# Patient Record
Sex: Male | Born: 1980 | Race: Black or African American | Hispanic: No | Marital: Single | Smoking: Current some day smoker
Health system: Southern US, Community
[De-identification: ages and names within clinical notes are randomized; demographics above are authoritative.]

## PROBLEM LIST (undated history)

## (undated) ENCOUNTER — Ambulatory Visit: Payer: Self-pay

---
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
## 1998-01-06 ENCOUNTER — Emergency Department (HOSPITAL_COMMUNITY): Admission: EM | Admit: 1998-01-06 | Discharge: 1998-01-06 | Payer: Self-pay | Admitting: Emergency Medicine

## 1998-01-11 ENCOUNTER — Encounter: Admission: RE | Admit: 1998-01-11 | Discharge: 1998-01-11 | Payer: Self-pay | Admitting: Internal Medicine

## 1998-01-11 ENCOUNTER — Ambulatory Visit (HOSPITAL_COMMUNITY): Admission: RE | Admit: 1998-01-11 | Discharge: 1998-01-11 | Payer: Self-pay | Admitting: Internal Medicine

## 1998-01-11 ENCOUNTER — Ambulatory Visit (HOSPITAL_COMMUNITY): Admission: RE | Admit: 1998-01-11 | Discharge: 1998-01-11 | Payer: Self-pay

## 1998-06-21 ENCOUNTER — Encounter: Admission: RE | Admit: 1998-06-21 | Discharge: 1998-06-21 | Payer: Self-pay | Admitting: Internal Medicine

## 2003-02-25 ENCOUNTER — Emergency Department (HOSPITAL_COMMUNITY): Admission: AD | Admit: 2003-02-25 | Discharge: 2003-02-25 | Payer: Self-pay | Admitting: Family Medicine

## 2003-11-17 ENCOUNTER — Emergency Department (HOSPITAL_COMMUNITY): Admission: EM | Admit: 2003-11-17 | Discharge: 2003-11-17 | Payer: Self-pay | Admitting: Emergency Medicine

## 2005-11-04 ENCOUNTER — Emergency Department (HOSPITAL_COMMUNITY): Admission: EM | Admit: 2005-11-04 | Discharge: 2005-11-04 | Payer: Self-pay | Admitting: Emergency Medicine

## 2008-01-12 ENCOUNTER — Emergency Department (HOSPITAL_COMMUNITY): Admission: EM | Admit: 2008-01-12 | Discharge: 2008-01-12 | Payer: Self-pay | Admitting: Emergency Medicine

## 2008-12-12 ENCOUNTER — Emergency Department (HOSPITAL_COMMUNITY): Admission: EM | Admit: 2008-12-12 | Discharge: 2008-12-12 | Payer: Self-pay | Admitting: Emergency Medicine

## 2010-06-23 LAB — POCT I-STAT, CHEM 8
BUN: 7 mg/dL (ref 6–23)
Calcium, Ion: 1.16 mmol/L (ref 1.12–1.32)
HCT: 49 % (ref 39.0–52.0)
Sodium: 140 mEq/L (ref 135–145)
TCO2: 27 mmol/L (ref 0–100)

## 2010-06-23 LAB — D-DIMER, QUANTITATIVE: D-Dimer, Quant: 0.25 ug/mL-FEU (ref 0.00–0.48)

## 2011-08-09 IMAGING — CR DG CHEST 2V
2 series · 2 of 2 positions shown · non-contrast
Comparison: None available.

CLINICAL DATA: 28-year-old male with cough and cold symptoms.
Smoker.

CHEST - 2 VIEW

[w chest pa *]
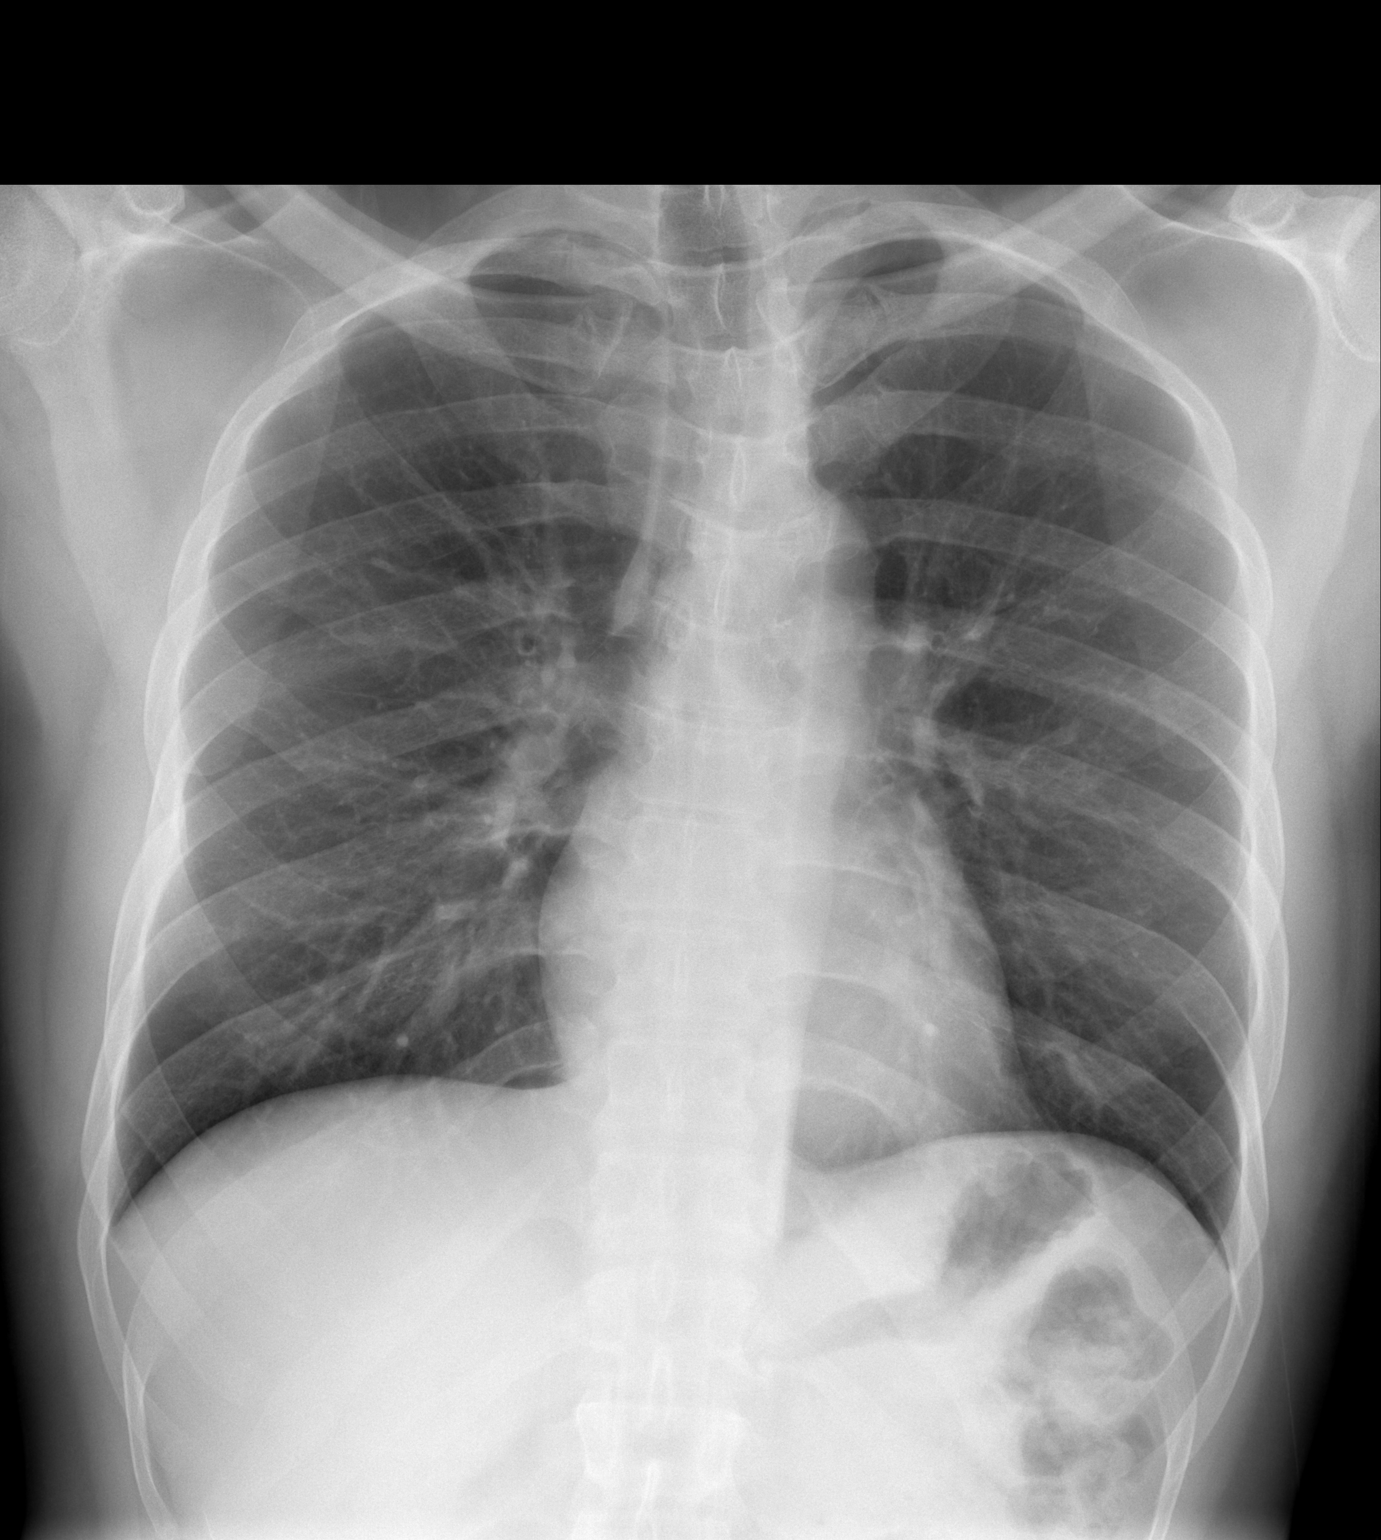

[w chest lat *]
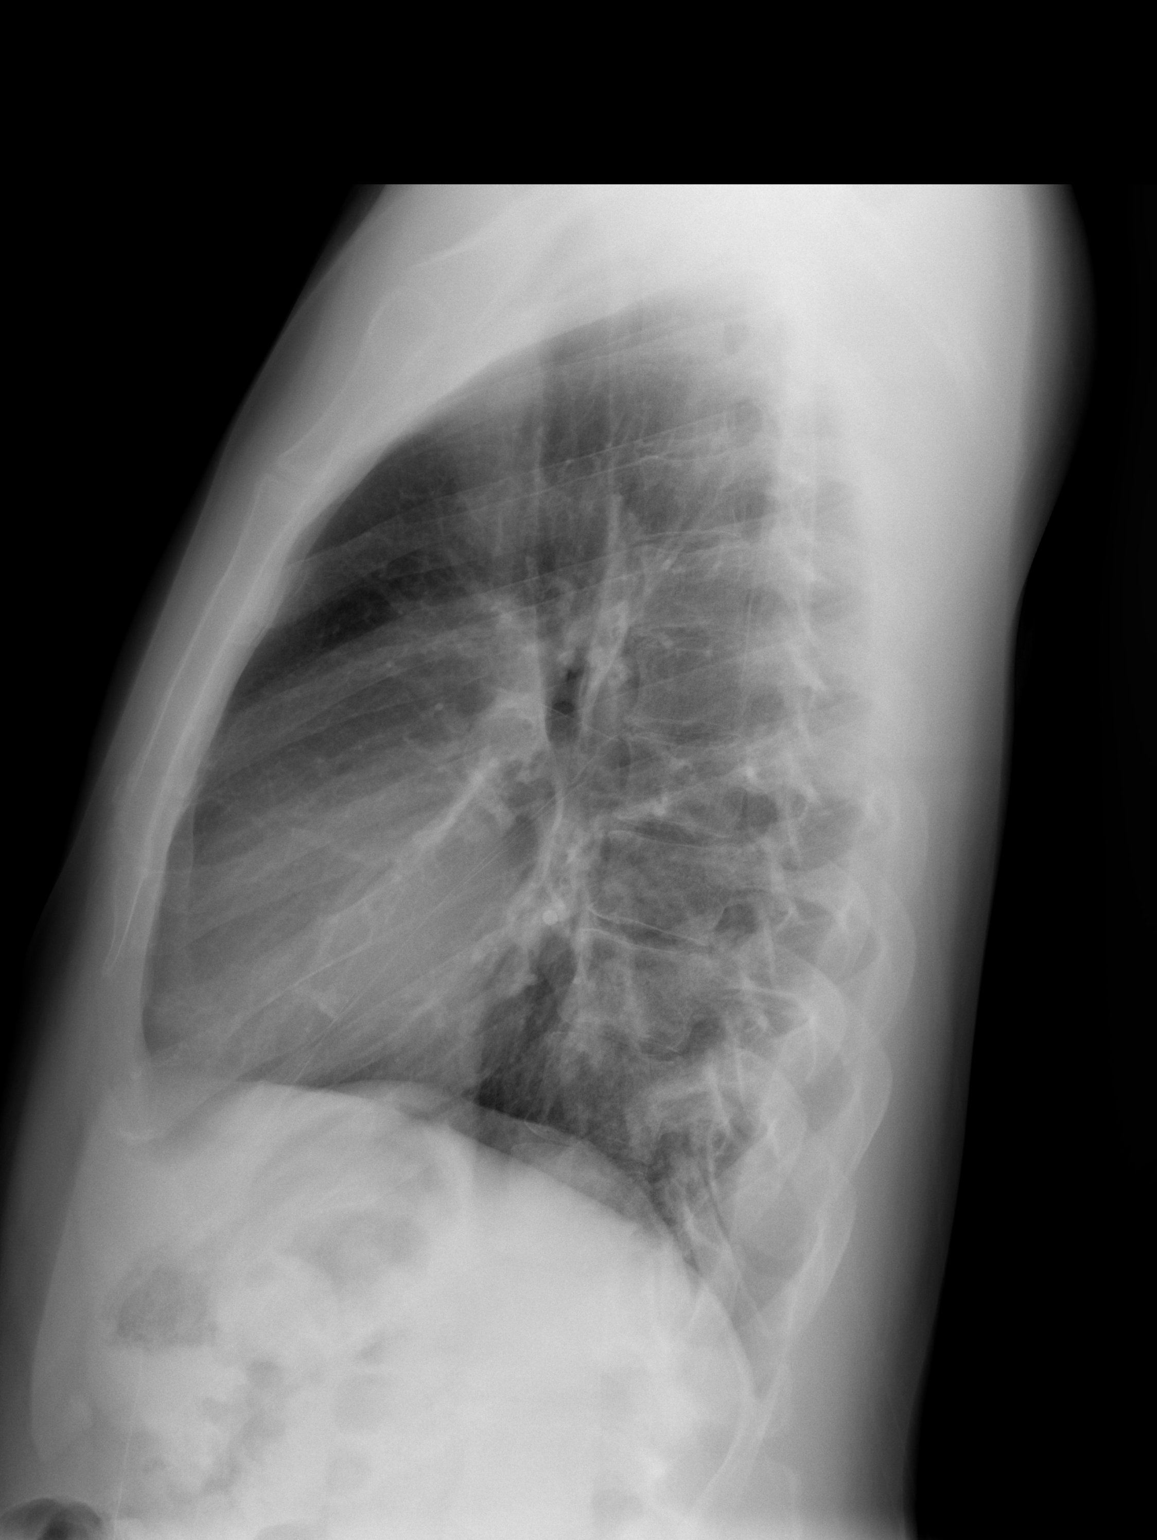

[2 of 2 positions shown; findings below may reference images not displayed]

FINDINGS: Cardiac size and mediastinal contours are within normal
limits.  Visualized tracheal air column is within normal limits.
No pneumothorax, pulmonary edema, pleural effusion, or
consolidation.  Mild volume loss at the left base, but no confluent
airspace opacity.  Mild scoliosis. No acute osseous abnormality
identified.
IMPRESSION: No acute cardiopulmonary abnormality.

## 2013-11-17 ENCOUNTER — Telehealth: Payer: Self-pay | Admitting: *Deleted

## 2013-11-17 NOTE — Telephone Encounter (Signed)
error 

## 2020-04-18 ENCOUNTER — Emergency Department (HOSPITAL_COMMUNITY): Payer: 59

## 2020-04-18 ENCOUNTER — Emergency Department (HOSPITAL_COMMUNITY)
Admission: EM | Admit: 2020-04-18 | Discharge: 2020-04-18 | Disposition: A | Discharge status: Home / Self Care | Payer: 59 | Attending: Emergency Medicine | Admitting: Emergency Medicine

## 2020-04-18 ENCOUNTER — Other Ambulatory Visit: Payer: Self-pay

## 2020-04-18 DIAGNOSIS — Z20822 Contact with and (suspected) exposure to covid-19: Secondary | ICD-10-CM | POA: Diagnosis not present

## 2020-04-18 DIAGNOSIS — R0602 Shortness of breath: Secondary | ICD-10-CM | POA: Diagnosis not present

## 2020-04-18 DIAGNOSIS — R079 Chest pain, unspecified: Secondary | ICD-10-CM | POA: Diagnosis present

## 2020-04-18 DIAGNOSIS — R0789 Other chest pain: Secondary | ICD-10-CM | POA: Diagnosis not present

## 2020-04-18 LAB — TROPONIN I (HIGH SENSITIVITY)
Troponin I (High Sensitivity): <2 ng/L (ref <18)
Troponin I (High Sensitivity): <2 ng/L (ref <18)

## 2020-04-18 LAB — CBC
HCT: 44.5 % (ref 39.0–52.0)
Hemoglobin: 15.5 g/dL (ref 13.0–17.0)
MCH: 31.8 pg (ref 26.0–34.0)
MCHC: 34.8 g/dL (ref 30.0–36.0)
MCV: 91.2 fL (ref 80.0–100.0)
Platelets: 177 10*3/uL (ref 150–400)
RBC: 4.88 MIL/uL (ref 4.22–5.81)
RDW: 13.2 % (ref 11.5–15.5)
WBC: 3.8 10*3/uL — ABNORMAL LOW (ref 4.0–10.5)
nRBC: 0 % (ref 0.0–0.2)

## 2020-04-18 LAB — SARS CORONAVIRUS 2 (TAT 6-24 HRS): SARS Coronavirus 2: NEGATIVE

## 2020-04-18 LAB — BASIC METABOLIC PANEL
Anion gap: 8 (ref 5–15)
BUN: 12 mg/dL (ref 6–20)
CO2: 23 mmol/L (ref 22–32)
Calcium: 9.6 mg/dL (ref 8.9–10.3)
Chloride: 108 mmol/L (ref 98–111)
Creatinine, Ser: 0.95 mg/dL (ref 0.61–1.24)
GFR, Estimated: >60 mL/min (ref >60)
Glucose, Bld: 104 mg/dL — ABNORMAL HIGH (ref 70–99)
Potassium: 3.9 mmol/L (ref 3.5–5.1)
Sodium: 139 mmol/L (ref 135–145)

## 2020-04-18 MED ORDER — ALBUTEROL SULFATE HFA 108 (90 BASE) MCG/ACT IN AERS
2.0000 | INHALATION_SPRAY | Freq: Once | RESPIRATORY_TRACT | Status: DC
  Filled 2020-04-18: qty 6.7

## 2020-04-18 MED ORDER — IBUPROFEN 400 MG PO TABS
600.0000 mg | ORAL_TABLET | Freq: Once | ORAL | Status: DC
  Filled 2020-04-18: qty 1

## 2020-04-18 NOTE — ED Provider Notes (Signed)
[facility] EMERGENCY DEPARTMENT Provider Note   CSN: [id number] Arrival date & time: 04/18/20  7169     History Chief Complaint  Patient presents with  . Chest Pain    Ryan Bailey is a 40 y.o. male who presents to the ED today with complaint of gradual onset, intermittent, achy, right sided chest pain that began last week. Pt reports that the pain seems to be present at work when he has to wear his mask - he states that since the new mask mandate has been in place in [facility] his work has made him wear a mask. Pt states he has a history of asthma and feels like he can't breathe when he wears a mask which then makes him have chest pain. Pt states when he takes the mask off and uses his inhaler the pain and shortness of breath will go away. He also mentions that he has been working about 72 hours this past week lifting approximately 50 pounds of material at work intermittently and is unsure if this could be causing his pain. He has not taken anything including Ibuprofen or Tylenol for the pain. Pt is a current everyday smoker - smokes 1 pack every 2-3 weeks. He also endorses Fhx of CAD. No hx DVT/PE. No recent prolonged travel or immobilization. No hemoptysis. No active malignancy. No exogenous hormone use. Denies fevers, chills, diaphoresis, nausea, vomiting, leg swelling, cough, or any other associated symptoms.   The history is provided by the patient and medical records.       No past medical history on file.  There are no problems to display for this patient.   No family history on file.     Home Medications Prior to Admission medications   Not on File    Allergies    Patient has no known allergies.  Review of Systems   Review of Systems  Constitutional: Negative for chills, diaphoresis and fever.  Respiratory: Positive for shortness of breath. Negative for cough.   Cardiovascular: Positive for chest pain.  Gastrointestinal: Negative for  nausea and vomiting.  All other systems reviewed and are negative.   Physical Exam Updated Vital Signs BP 109/70   Pulse (!) 58   Temp 98.8 F (37.1 C) (Oral)   Resp 15   Ht 5\' 8"  (1.727 m)   Wt 99.8 kg   SpO2 100%   BMI 33.45 kg/m   Physical Exam Vitals and nursing note reviewed.  Constitutional:      Appearance: He is not ill-appearing or diaphoretic.  HENT:     Head: Normocephalic and atraumatic.  Eyes:     Conjunctiva/sclera: Conjunctivae normal.  Cardiovascular:     Rate and Rhythm: Normal rate and regular rhythm.     Pulses:          Radial pulses are 2+ on the right side and 2+ on the left side.       Dorsalis pedis pulses are 2+ on the right side and 2+ on the left side.     Heart sounds: Normal heart sounds.  Pulmonary:     Effort: Pulmonary effort is normal.     Breath sounds: Normal breath sounds. No decreased breath sounds, wheezing, rhonchi or rales.  Chest:     Chest wall: Tenderness present.     Comments: + right sided chest wall TTP Abdominal:     Palpations: Abdomen is soft.     Tenderness: There is no abdominal tenderness. There is  no guarding or rebound.  Musculoskeletal:     Cervical back: Neck supple.     Right lower leg: No tenderness. No edema.     Left lower leg: No tenderness. No edema.  Skin:    General: Skin is warm and dry.  Neurological:     Mental Status: He is alert.     ED Results / Procedures / Treatments   Labs (all labs ordered are listed, but only abnormal results are displayed) Labs Reviewed  BASIC METABOLIC PANEL - Abnormal; Notable for the following components:      Result Value   Glucose, Bld 104 (*)    All other components within normal limits  CBC - Abnormal; Notable for the following components:   WBC 3.8 (*)    All other components within normal limits  SARS CORONAVIRUS 2 (TAT 6-24 HRS)  TROPONIN I (HIGH SENSITIVITY)  TROPONIN I (HIGH SENSITIVITY)    EKG None  Radiology DG Chest 2 View  Result Date:  04/18/2020 CLINICAL DATA:  Chest pain, asthma EXAM: CHEST - 2 VIEW COMPARISON:  12/12/2008 FINDINGS: Lungs are well expanded, symmetric, and clear. No pneumothorax or pleural effusion. Cardiac size within normal limits. Pulmonary vascularity is normal. Osseous structures are age-appropriate. No acute bone abnormality. IMPRESSION: No active cardiopulmonary disease. Electronically Signed   By: Helyn Numbers MD   On: 04/18/2020 10:18    Procedures Procedures   Medications Ordered in ED Medications  ibuprofen (ADVIL) tablet 600 mg (600 mg Oral Not Given 04/18/20 1328)  albuterol (VENTOLIN HFA) 108 (90 Base) MCG/ACT inhaler 2 puff (has no administration in time range)    ED Course  I have reviewed the triage vital signs and the nursing notes.  Pertinent labs & imaging results that were available during my care of the patient were reviewed by me and considered in my medical decision making (see chart for details).    MDM Rules/Calculators/A&P                          40 year old male who presents to the ED today complaining of intermittent right-sided chest pain that he attributes to having to wear a mask at work.  Reports history of asthma and states that he cannot wear a mask for a prolonged period of time.  Patient also endorses he has been working more often than normal where he is required to lift 50 pounds at a time.  He has not been taking anything for his pain.  On arrival to the ED vitals are stable.  Patient is afebrile, nontachycardic and nontachypneic.  EKG done while in the waiting room does show nonspecific T wave abnormality, no previous to compare to.  X-ray without signs of infection.  CBC with a leukopenia of 3.8.  BMP without electrolyte abnormalities.  Hemoglobin stable at 15.5.  Initial troponin less than 2.  Appears that a repeat troponin has already been collected and is in process.  On my exam patient appears to be in no acute distress.  He does have some chest wall tenderness  palpation which I suspect is musculoskeletal in nature given recent increase in working and heavy lifting.  We will plan to await on this repeat Trope however I have very low suspicion for ACS at this time.  He has no risk factors for PE and I am able to [place] him out.  He does report he is unsure if he has been around anyone who has been  sick as there has been many people out at work.  We will plan to swab for Covid as well.  Repeat troponin less than 2.  Will discharge home at this time.  Nursing staff informing that patient is requesting an albuterol inhaler at this time as he reports a history of asthma.  He has no wheezing on exam.  I am not able to see history of asthma at this time and patient did tell me that he was using an albuterol inhaler between wearing a mask at work.  Will provide at this time.  He is apparently also requesting a work note to state that he can have breaks from his mask.  I do not feel this is appropriate today.  We will have him discuss this with his PCP.  Patient to be discharged home at this time.  This note was prepared using Dragon voice recognition software and may include unintentional dictation errors due to the inherent limitations of voice recognition software.  Final Clinical Impression(s) / ED Diagnoses Final diagnoses:  Chest wall pain    Rx / DC Orders ED Discharge Orders    None       Discharge Instructions     Your workup was reassuring today. We have swabbed you for COVID - your results should return within the next day. Please await your results.   I would recommend Ibuprofen and Tylenol as needed for pain.   Follow up with your PCP regarding your ED visit today  Return to the ED for any worsening symptoms       Tanda Rockers, PA-C 04/18/20 1410    Virgina Norfolk, DO 04/18/20 1522

## 2020-04-18 NOTE — ED Triage Notes (Signed)
Pt reports R sided chest pain since Wednesday. Pain lessens with movement/activity. Does report working >60 hours last week and first noticed pain while at work.

## 2020-04-18 NOTE — ED Notes (Signed)
Reviewed discharge instructions with patient. Follow-up care and medications reviewed. Patient  verbalized understanding. Patient A&Ox4, VSS, and ambulatory with steady gait upon discharge.  °

## 2020-04-18 NOTE — Discharge Instructions (Addendum)
Your workup was reassuring today. We have swabbed you for COVID - your results should return within the next day. Please await your results.   I would recommend Ibuprofen and Tylenol as needed for pain.   Follow up with your PCP regarding your ED visit today  Return to the ED for any worsening symptoms

## 2020-06-06 ENCOUNTER — Other Ambulatory Visit: Payer: Self-pay

## 2020-06-06 ENCOUNTER — Ambulatory Visit
Admission: EM | Admit: 2020-06-06 | Discharge: 2020-06-06 | Disposition: A | Discharge status: Home / Self Care | Payer: 59

## 2020-06-06 DIAGNOSIS — R040 Epistaxis: Secondary | ICD-10-CM

## 2020-06-06 DIAGNOSIS — G4440 Drug-induced headache, not elsewhere classified, not intractable: Secondary | ICD-10-CM | POA: Diagnosis not present

## 2020-06-06 DIAGNOSIS — T3995XA Adverse effect of unspecified nonopioid analgesic, antipyretic and antirheumatic, initial encounter: Secondary | ICD-10-CM

## 2020-06-06 MED ORDER — TIZANIDINE HCL 2 MG PO TABS: 4.0000 mg | ORAL_TABLET | Freq: Four times a day (QID) | ORAL | 0 refills | Status: DC | PRN

## 2020-06-06 MED ORDER — TIZANIDINE HCL 4 MG PO TABS: 4.0000 mg | ORAL_TABLET | Freq: Every day | ORAL | 0 refills | Status: AC

## 2020-06-06 NOTE — Discharge Instructions (Addendum)
Discontinue taking aspirin.  For acute pain naproxen 500 mg twice daily as needed (brand name Aleve) for arthritis pain.  I sent over a muscle relaxer for you to take at bedtime only for muscular pain or  headache.

## 2020-06-06 NOTE — ED Triage Notes (Signed)
Pt c/o migraine headache since Thursday. States on Friday when he had his headache his nose started bleeding heavy BRB. States had 2 nose bleeds today. States his headaches goes away after his nosebleeds.

## 2020-06-06 NOTE — ED Provider Notes (Signed)
[facility]    CSN: [id number] Arrival date & time: 06/06/20  1839      History   Chief Complaint Chief Complaint  Patient presents with   Migraine    HPI Ryan Bailey is a 40 y.o. male.   HPI  Patient here for several days of epistaxis and HA. Patient has been taking 1000 mg of ASA for a few weeks for foot and back pain. He is also taking Naproxen at the same time. No tarry stool. No hemoptysis. Endorses a dull HA that has persistent for several days. NO N&V or light sensitivity     History reviewed. No pertinent past medical history.  There are no problems to display for this patient.   History reviewed. No pertinent surgical history.     Home Medications    Prior to Admission medications   Not on File    Family History History reviewed. No pertinent family history.  Social History Social History   Tobacco Use   Smoking status: Current Every Day Smoker   Smokeless tobacco: Never Used  Substance Use Topics   Alcohol use: Yes    Comment: OCC   Drug use: Not Currently     Allergies   Penicillins   Review of Systems Review of Systems Pertinent negatives listed in HPI   Physical Exam Triage Vital Signs ED Triage Vitals  Enc Vitals Group     BP 06/06/20 1940 115/74     Pulse Rate 06/06/20 1940 66     Resp 06/06/20 1940 20     Temp 06/06/20 1940 98.3 F (36.8 C)     Temp Source 06/06/20 1940 Oral     SpO2 06/06/20 1940 96 %     Weight --      Height --      Head Circumference --      Peak Flow --      Pain Score 06/06/20 1953 8     Pain Loc --      Pain Edu? --      Excl. in GC? --    No data found.  Updated Vital Signs BP 115/74 (BP Location: Left Arm)    Pulse 66    Temp 98.3 F (36.8 C) (Oral)    Resp 20    SpO2 96%   Visual Acuity Right Eye Distance:   Left Eye Distance:   Bilateral Distance:    Right Eye Near:   Left Eye Near:    Bilateral Near:     Physical Exam  General Appearance:    Alert,  cooperative, no distress  HENT:   Normocephalic, ears normal, nares normal,  oropharynx cleat   Eyes:    PERRL, conjunctiva/corneas clear, EOM's intact       Lungs:     Clear to auscultation bilaterally, respirations unlabored  Heart:    Regular rate and rhythm  Neurologic:   Awake, alert, oriented x 3. No apparent focal neurological           defect.      UC Treatments / Results  Labs (all labs ordered are listed, but only abnormal results are displayed) Labs Reviewed - No data to display  EKG   Radiology No results found.  Procedures Procedures (including critical care time)  Medications Ordered in UC Medications - No data to display  Initial Impression / Assessment and Plan / UC Course  I have reviewed the triage vital signs and the nursing notes.  Pertinent labs &  imaging results that were available during my care of the patient were reviewed by me and considered in my medical decision making (see chart for details).     Discontinue ASA. Tizanidine PRN as prescribed for HA. For aches and pain, start Naproxen OTC    Final Clinical Impressions(s) / UC Diagnoses   Final diagnoses:  Other migraine without status migrainosus, not intractable  Analgesic rebound headache     Discharge Instructions     Discontinue taking aspirin.  For acute pain naproxen 500 mg twice daily as needed (brand name Aleve) for arthritis pain.  I sent over a muscle relaxer for you to take at bedtime only for muscular pain or  headache.    ED Prescriptions    Medication Sig Dispense Auth. Provider   tiZANidine (ZANAFLEX) 2 MG tablet  (Status: Discontinued) Take 2 tablets (4 mg total) by mouth every 6 (six) hours as needed for muscle spasms. 30 tablet Bing Neighbors, FNP   tiZANidine (ZANAFLEX) 4 MG tablet Take 1 tablet (4 mg total) by mouth at bedtime. 30 tablet Bing Neighbors, FNP     PDMP not reviewed this encounter.   Bing Neighbors, [place] 06/07/20 [phone]

## 2020-10-23 ENCOUNTER — Ambulatory Visit
Admission: EM | Admit: 2020-10-23 | Discharge: 2020-10-23 | Disposition: A | Discharge status: Home / Self Care | Payer: 59 | Attending: Urgent Care | Admitting: Urgent Care

## 2020-10-23 NOTE — ED Triage Notes (Signed)
Patient presents to Urgent Care with complaints of cough, dizziness, sweating, and headache x 2-3 days ago. Pt states he concerned may be related to high BP since he has a family hx of HTN. He also concerned it may be related to covid with several co workers testing positive for COVID.   Denies fever, SOB, or chest pain.

## 2020-10-23 NOTE — ED Notes (Signed)
Pt was brought back he declined to be seen by provider today. He states he prefers to wait to come back tomorrow.

## 2022-03-30 ENCOUNTER — Ambulatory Visit (INDEPENDENT_AMBULATORY_CARE_PROVIDER_SITE_OTHER): Payer: Self-pay | Admitting: Physician Assistant

## 2022-03-30 ENCOUNTER — Encounter (HOSPITAL_COMMUNITY): Payer: Self-pay | Admitting: Physician Assistant

## 2022-03-30 ENCOUNTER — Ambulatory Visit (INDEPENDENT_AMBULATORY_CARE_PROVIDER_SITE_OTHER): Payer: No Payment, Other | Admitting: Clinical

## 2022-03-30 DIAGNOSIS — F331 Major depressive disorder, recurrent, moderate: Secondary | ICD-10-CM

## 2022-03-30 DIAGNOSIS — F99 Mental disorder, not otherwise specified: Secondary | ICD-10-CM

## 2022-03-30 DIAGNOSIS — F3160 Bipolar disorder, current episode mixed, unspecified: Secondary | ICD-10-CM

## 2022-03-30 DIAGNOSIS — F5105 Insomnia due to other mental disorder: Secondary | ICD-10-CM

## 2022-03-30 MED ORDER — ARIPIPRAZOLE 5 MG PO TABS: 5.0000 mg | ORAL_TABLET | Freq: Every day | ORAL | 1 refills | Status: DC

## 2022-03-30 MED ORDER — TRAZODONE HCL 50 MG PO TABS: 50.0000 mg | ORAL_TABLET | Freq: Every day | ORAL | 1 refills | Status: AC
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
## 2022-04-20 ENCOUNTER — Ambulatory Visit
Admission: EM | Admit: 2022-04-20 | Discharge: 2022-04-20 | Disposition: A | Discharge status: Home / Self Care | Payer: Self-pay | Attending: Physician Assistant | Admitting: Physician Assistant

## 2022-04-20 ENCOUNTER — Ambulatory Visit: Payer: Self-pay

## 2022-04-20 DIAGNOSIS — H8110 Benign paroxysmal vertigo, unspecified ear: Secondary | ICD-10-CM | POA: Insufficient documentation

## 2022-04-20 DIAGNOSIS — Z1152 Encounter for screening for COVID-19: Secondary | ICD-10-CM | POA: Insufficient documentation

## 2022-04-20 DIAGNOSIS — J069 Acute upper respiratory infection, unspecified: Secondary | ICD-10-CM | POA: Insufficient documentation

## 2022-04-20 MED ORDER — MECLIZINE HCL 25 MG PO TABS: 25.0000 mg | ORAL_TABLET | Freq: Three times a day (TID) | ORAL | 0 refills | Status: AC | PRN

## 2022-04-20 NOTE — ED Triage Notes (Addendum)
Patient presents to [facility] [place] for dizziness since yesterday. Back pain and cough since today. Concerned with possible covid, exposed to sick co-worker. Pt also concerned with carbon monoxide exposure. States he was in a clients home that had a weird smell. Reports he has noticed when he changes position he gets dizzy this has been on-going for 2 weeks. Also reports that he quit drinking alcohol approx. 4 months ago. States he would have a pint a day. Concerned with possible wtihdrawal side effects.
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
## 2022-04-21 LAB — SARS CORONAVIRUS 2 (TAT 6-24 HRS): SARS Coronavirus 2: POSITIVE — AB

## 2022-04-24 ENCOUNTER — Ambulatory Visit (INDEPENDENT_AMBULATORY_CARE_PROVIDER_SITE_OTHER): Payer: No Payment, Other | Admitting: Clinical

## 2022-04-24 DIAGNOSIS — F331 Major depressive disorder, recurrent, moderate: Secondary | ICD-10-CM
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
## 2022-05-08 ENCOUNTER — Ambulatory Visit (INDEPENDENT_AMBULATORY_CARE_PROVIDER_SITE_OTHER): Payer: No Payment, Other | Admitting: Clinical

## 2022-05-08 ENCOUNTER — Ambulatory Visit (INDEPENDENT_AMBULATORY_CARE_PROVIDER_SITE_OTHER): Payer: No Payment, Other | Admitting: Physician Assistant

## 2022-05-08 ENCOUNTER — Encounter (HOSPITAL_COMMUNITY): Payer: Self-pay | Admitting: Physician Assistant

## 2022-05-08 VITALS — BP 128/78 | HR 67 | Temp 98.2°F | Ht 70.0 in | Wt 210.0 lb

## 2022-05-08 DIAGNOSIS — F99 Mental disorder, not otherwise specified: Secondary | ICD-10-CM

## 2022-05-08 DIAGNOSIS — F331 Major depressive disorder, recurrent, moderate: Secondary | ICD-10-CM | POA: Diagnosis not present

## 2022-05-08 DIAGNOSIS — F320 Major depressive disorder, single episode, mild: Secondary | ICD-10-CM | POA: Diagnosis not present

## 2022-05-08 DIAGNOSIS — F5105 Insomnia due to other mental disorder: Secondary | ICD-10-CM | POA: Diagnosis not present

## 2022-05-08 MED ORDER — ARIPIPRAZOLE 5 MG PO TABS: 5.0000 mg | ORAL_TABLET | Freq: Every day | ORAL | 1 refills | Status: AC

## 2022-05-08 NOTE — Progress Notes (Signed)
[place] MD/PA/NP OP Progress Note  05/08/2022 8:44 PM Ryan Bailey  MRN:  [id number]  Chief Complaint:  Chief Complaint  Patient presents with   Follow-up   Medication Refill   HPI:   Ryan Bailey is a 42 year old, African-American male with a past psychiatric history significant for possible bipolar disorder and insomnia who presents to [facility] for follow up and medication management.  Patient is currently being managed on the following psychiatric medications:  Abilify 5 mg daily Trazodone 50 mg at bedtime as needed  Patient reports that he was recently kicked out of the home by his partner.  In addition to being kicked out of the house, patient states that his ex-partner had out a 50-B on him.  Patient reports that he was kicked out of the house because his ex-partner stated that he was drinking alcohol while taking pills in the presence of suicidal ideations.  Patient admits to drinking alcohol prior to being kicked out of the house but denies taking pills at the same time.  Since being kicked out of the house, patient endorses elevated anxiety that he rates at 10 out of 10.  Patient notes that he wants to be back home with his partner and her kids.  He reports that being around the kids makes him want to be better.  Patient is currently living in a motel with his 20 year old child.  Patient denies suicidal ideations and states that he has been going to [place] as well as therapy.  Patient denies depressive episodes but states that it hurts that he is unable to see his ex partner's kids.  He reports that his 95 year old son wants to go back to the house but states that he his son does not want to leave him in the hotel..  Patient reports that he will keep his distance from his ex-partner stating that if the 50-B goes through, then he will lose his job.  Before concluding the encounter, patient acknowledged that whenever he was down and out, his ex-partner  was there for him.  He states that she has been with him through thick and thin and he is sorry that he disappointed her by drinking.  Patient states that he is sorry for not having enough sense and feels like a wounded dog in this world and he is just trying to survive.  A PHQ-9 screen was performed with the patient scoring a 9.  Although patient endorsed anxiety, he scored a 0 during his GAD-7 screen.  Patient is alert and oriented x 4, calm, cooperative, and engaged in conversation during the encounter.  Patient states that his mood is all right but reports that he is worried about his ex-partner and that he feels hopeless at this time.  Patient denies suicidal or homicidal ideations.  Patient further denies auditory or visual hallucinations and does not appear to be responding to internal/external stimuli.  Patient endorses fair sleep and receives on average 8 hours of sleep each night.  Patient endorses fair appetite and eats on average 2 meals per day.  Patient denies alcohol consumption at this time.  Patient endorses tobacco use stating that he last used 3 days ago.  Patient denies illicit drug use.  Visit Diagnosis:    ICD-10-CM   1. Insomnia due to other mental disorder  F51.05    F99     2. Current mild episode of major depressive disorder, unspecified whether recurrent (HCC)  F32.0 ARIPiprazole (ABILIFY) 5  MG tablet      Past Psychiatric History:  Major depressive disorder vs bipolar disorder Insomnia  Past Medical History: History reviewed. No pertinent past medical history. History reviewed. No pertinent surgical history.  Family Psychiatric History:  Patient is unsure of a family history of psychiatric illness   Family history of suicide: Patient states that his brother killed himself 2 years ago Family history of homicide: Patient reports that his cousin is in prison for allegedly killing someone Family history of substance abuse: Patient reports that both parents abuse drugs  and alcohol  Family History: History reviewed. No pertinent family history.  Social History:  Social History   Socioeconomic History   Marital status: Single    Spouse name: Not on file   Number of children: Not on file   Years of education: Not on file   Highest education level: Not on file  Occupational History   Not on file  Tobacco Use   Smoking status: Some Days    Types: Cigarettes   Smokeless tobacco: Never  Vaping Use   Vaping Use: Never used  Substance and Sexual Activity   Alcohol use: Yes   Drug use: Not Currently   Sexual activity: Not on file  Other Topics Concern   Not on file  Social History Narrative   Not on file   Social Determinants of Health   Financial Resource Strain: Not on file  Food Insecurity: Not on file  Transportation Needs: Not on file  Physical Activity: Not on file  Stress: Not on file  Social Connections: Not on file    Allergies:  Allergies  Allergen Reactions   Penicillins     Metabolic Disorder Labs: No results found for: "HGBA1C", "MPG" No results found for: "PROLACTIN" No results found for: "CHOL", "TRIG", "HDL", "CHOLHDL", "VLDL", "LDLCALC" No results found for: "TSH"  Therapeutic Level Labs: No results found for: "LITHIUM" No results found for: "VALPROATE" No results found for: "CBMZ"  Current Medications: Current Outpatient Medications  Medication Sig Dispense Refill   ARIPiprazole (ABILIFY) 5 MG tablet Take 1 tablet (5 mg total) by mouth daily. 30 tablet 1   meclizine (ANTIVERT) 25 MG tablet Take 1 tablet (25 mg total) by mouth 3 (three) times daily as needed for dizziness. 30 tablet 0   tiZANidine (ZANAFLEX) 4 MG tablet Take 1 tablet (4 mg total) by mouth at bedtime. 30 tablet 0   traZODone (DESYREL) 50 MG tablet Take 1 tablet (50 mg total) by mouth at bedtime. 30 tablet 1   No current facility-administered medications for this visit.     Musculoskeletal: Strength & Muscle Tone: within normal limits Gait  & Station: normal Patient leans: N/A  Psychiatric Specialty Exam: Review of Systems  Psychiatric/Behavioral:  Negative for decreased concentration, dysphoric mood, hallucinations, self-injury, sleep disturbance and suicidal ideas. The patient is nervous/anxious. The patient is not hyperactive.     There were no vitals taken for this visit.There is no height or weight on file to calculate BMI.  General Appearance: Casual  Eye Contact:  Good  Speech:  Clear and Coherent and Normal Rate  Volume:  Normal  Mood:  Anxious and Depressed  Affect:  Appropriate  Thought Process:  Coherent and Descriptions of Associations: Intact  Orientation:  Full (Time, Place, and Person)  Thought Content: WDL   Suicidal Thoughts:  No  Homicidal Thoughts:  No  Memory:  Immediate;   Good Recent;   Good Remote;   Fair  Judgement:  Fair  Insight:  Shallow  Psychomotor Activity:  Normal  Concentration:  Concentration: Good and Attention Span: Good  Recall:  Good  Fund of Knowledge: Good  Language: Good  Akathisia:  No  Handed:  Right  AIMS (if indicated): not done  Assets:  Communication Skills Desire for Improvement Housing Social Support Vocational/Educational  ADL's:  Intact  Cognition: WNL  Sleep:  Fair   Screenings: GAD-7    Flowsheet Row Clinical Support from 05/08/2022 in [facility] Office Visit from 03/30/2022 in [facility]  Total GAD-7 Score 0 21      PHQ2-9    Flowsheet Row Clinical Support from 05/08/2022 in [facility] Counselor from 03/30/2022 in [facility]  PHQ-2 Total Score 0 2  PHQ-9 Total Score 9 8      Flowsheet Row Clinical Support from 05/08/2022 in [facility] ED from 04/20/2022 in [facility] Office Visit from 03/30/2022 in [place]  No Risk No Risk No Risk        Assessment and Plan:   Ryan Bailey is a 42 year old, African-American male with a past psychiatric history significant for possible bipolar disorder and insomnia who presents to [facility] for follow up and medication management.  Patient appears to exhibit some depressive symptoms and anxiety attributed to recently being kicked out of his ex-partner's house due to alleged alcohol consumption with pills in the setting of suicidal ideations.  Patient states that his ex-partner also took out a 50-B on him and is fearful that the 50-B will cost him his job if it goes through.  Patient exhibited remorse during the assessment regarding all that he has put his ex-partner through.  During the assessment, patient asked if his ex-partner would care if he died.  Provider informed patient that making a threat on her life if not the way to his ex partner to care about him.  Patient denied wanting to harm himself or making threats on his life but expressed that he feels that his [place] does not care about him anymore.  Provider recommended patient focus on himself by getting himself better and attending AA.  Provider also recommended patient continuing to take his medications as prescribed.  Patient was agreeable to recommendation.  Upon assessment, patient symptoms do not appear to be strongly associated with bipolar disorder.  Patient's symptoms appear to reflect major depressive disorder attributed to experiencing many losses in his life as well as being away from his ex-partner at this time.  Provider to treat patient for his MDD with Abilify 5 mg daily.  Patient to continue taking trazodone 50 mg at bedtime for the management of his sleep.  Patient's medication to be e-prescribed to pharmacy of choice.  Collaboration of Care: Collaboration of Care: Medication Management AEB provider managing patient's psychiatric medications,  Psychiatrist AEB patient being followed by mental health provider, and Referral or follow-up with counselor/therapist AEB patient being seen by a licensed clinical social worker at this facility  Patient/Guardian was advised Release of Information must be obtained prior to any record release in order to collaborate their care with an outside provider. Patient/Guardian was advised if they have not already done so to contact the registration department to sign all necessary forms in order for [place] to release information regarding their care.   Consent: Patient/Guardian gives verbal  consent for treatment and assignment of benefits for services provided during this visit. Patient/Guardian expressed understanding and agreed to proceed.   Patient to follow-up in 7 weeks Provider spent a total of 16 minutes with the patient/reviewing patient's chart  Malachy Mood, PA 05/08/2022, 8:44 PM
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
## 2022-05-22 ENCOUNTER — Ambulatory Visit (HOSPITAL_COMMUNITY): Payer: No Payment, Other | Admitting: Clinical

## 2022-06-26 ENCOUNTER — Encounter (HOSPITAL_COMMUNITY): Payer: No Payment, Other | Admitting: Physician Assistant

## 2022-12-14 IMAGING — DX DG CHEST 2V
2 series · 2 of 2 positions shown · non-contrast
Comparison: 12/12/2008

CLINICAL DATA: Chest pain, asthma

EXAM:
CHEST - 2 VIEW

[w chest pa]
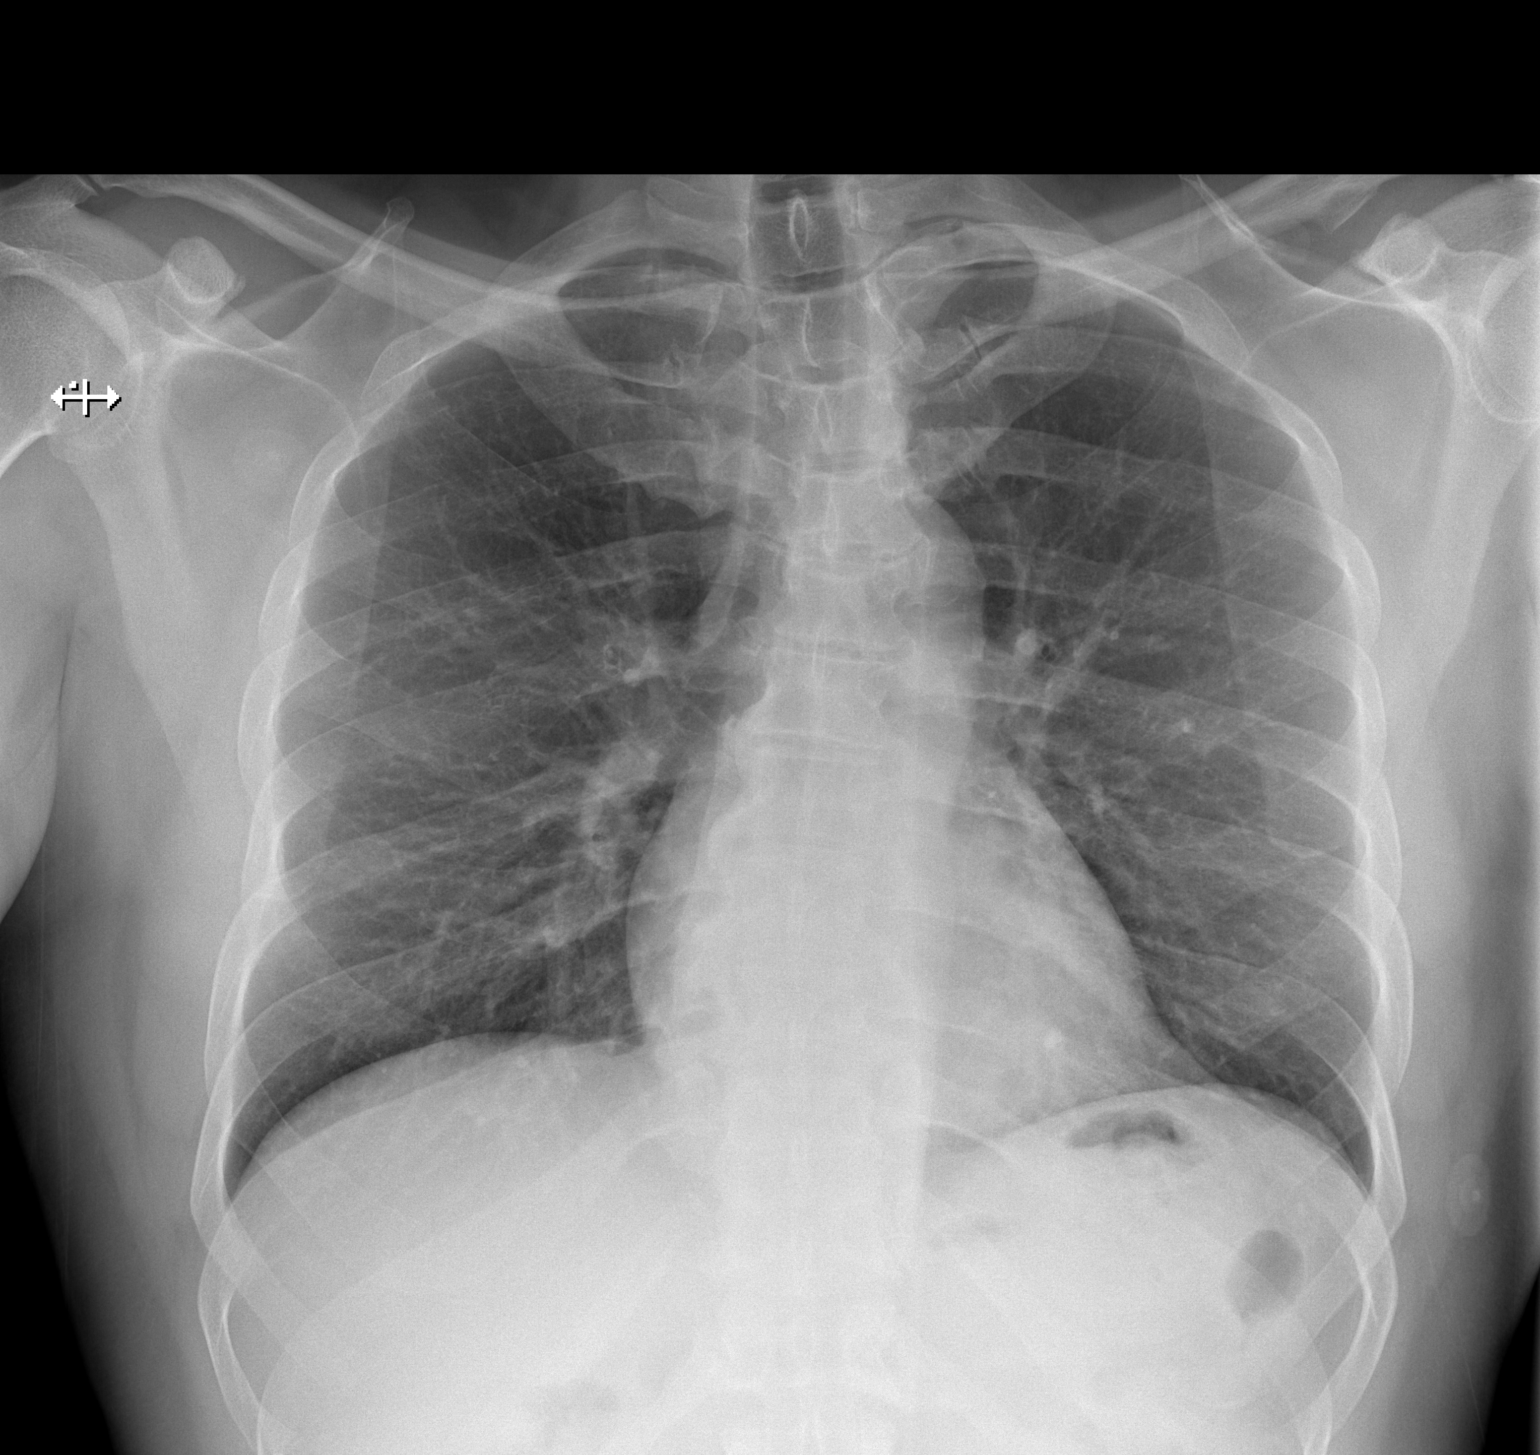

[w chest lat]
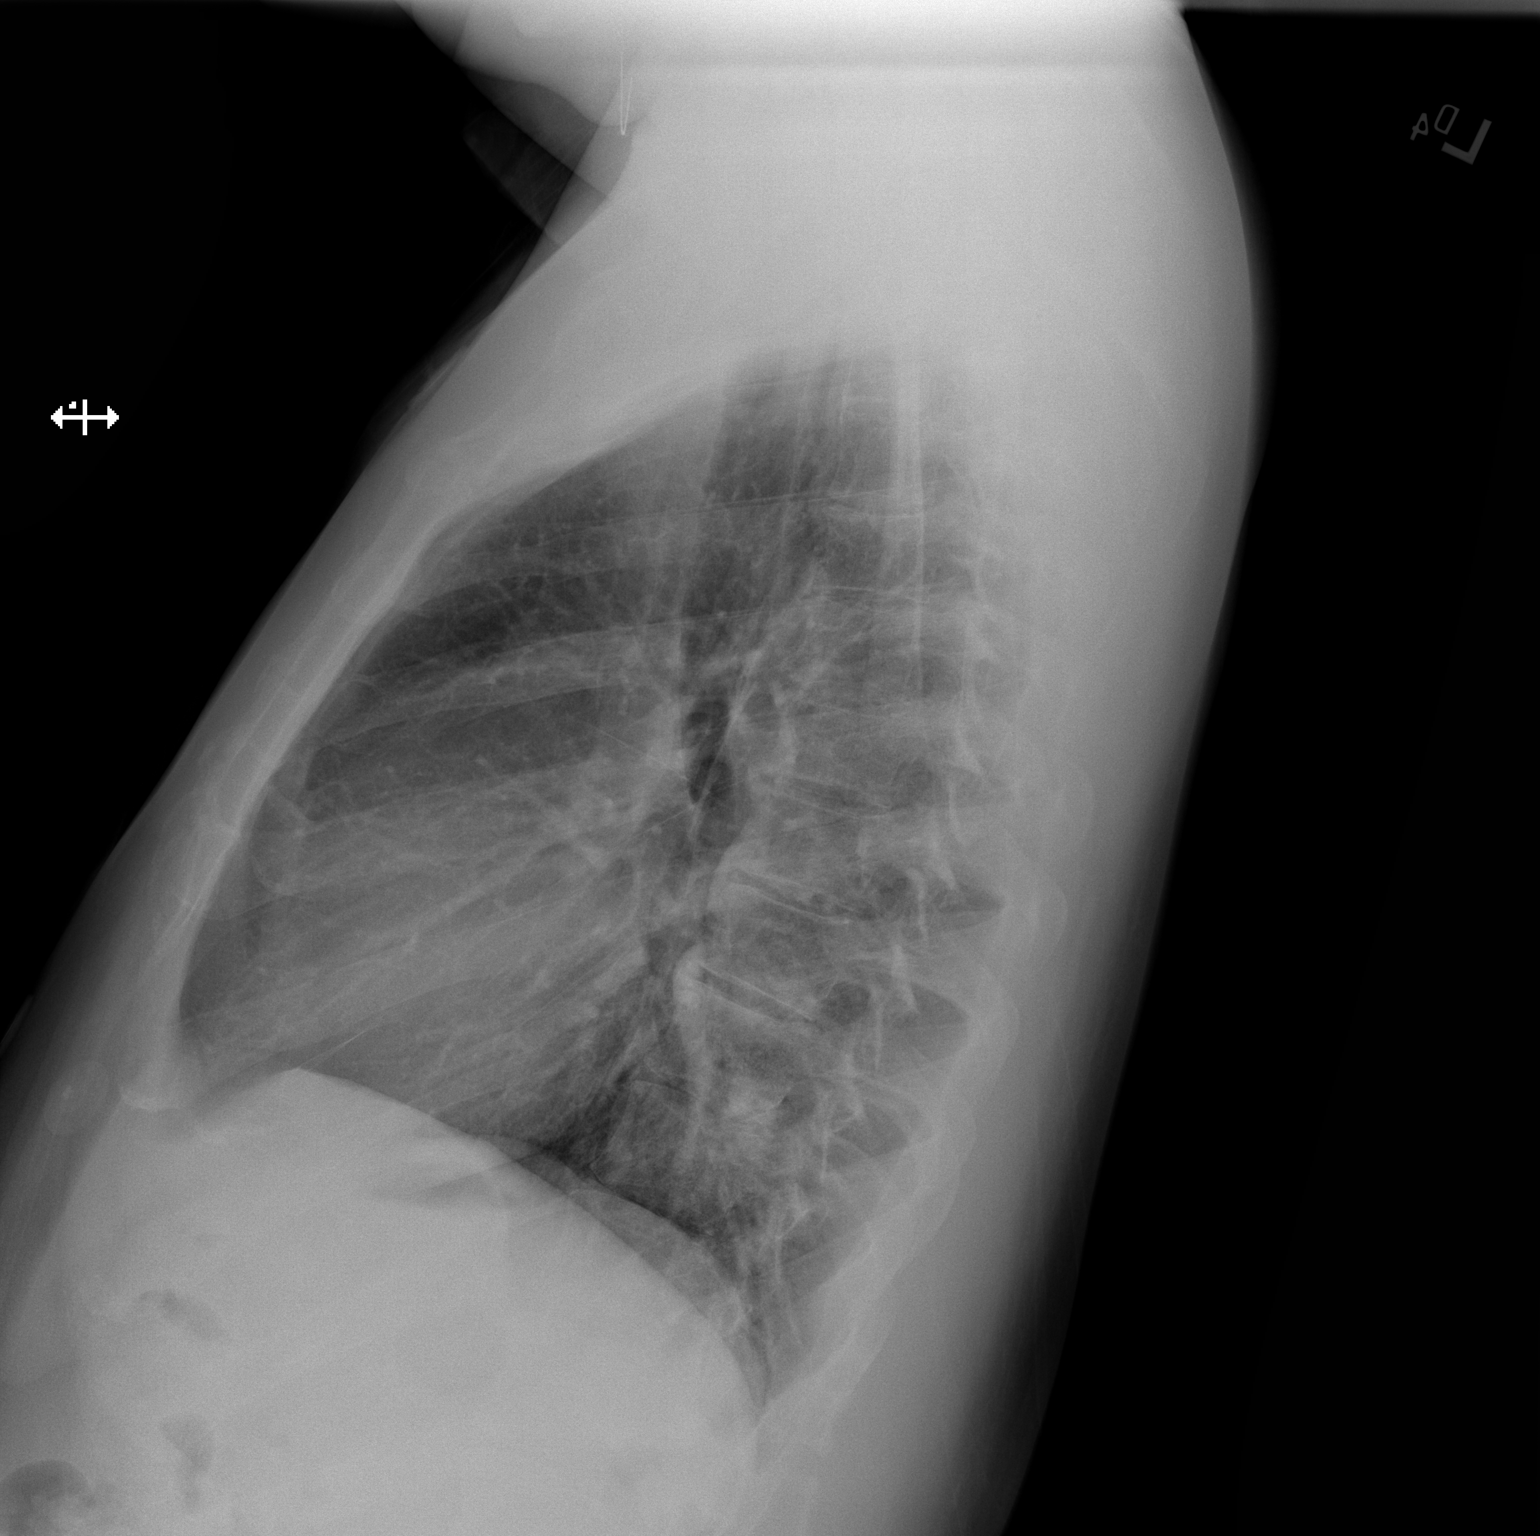

[2 of 2 positions shown; findings below may reference images not displayed]

FINDINGS: Lungs are well expanded, symmetric, and clear. No pneumothorax or
pleural effusion. Cardiac size within normal limits. Pulmonary
vascularity is normal. Osseous structures are age-appropriate. No
acute bone abnormality.
IMPRESSION: No active cardiopulmonary disease.
